# Patient Record
Sex: Male | Born: 1995 | Race: Black or African American | Hispanic: No | Marital: Single | Smoking: Never smoker
Health system: Southern US, Community
[De-identification: ages and names within clinical notes are randomized; demographics above are authoritative.]

---
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
## 2018-06-28 ENCOUNTER — Emergency Department (HOSPITAL_COMMUNITY)
Admission: EM | Admit: 2018-06-28 | Discharge: 2018-06-29 | Disposition: A | Discharge status: Home / Self Care | Payer: Self-pay | Attending: Emergency Medicine | Admitting: Emergency Medicine

## 2018-06-28 ENCOUNTER — Emergency Department (HOSPITAL_COMMUNITY): Payer: Self-pay

## 2018-06-28 ENCOUNTER — Encounter (HOSPITAL_COMMUNITY): Payer: Self-pay

## 2018-06-28 ENCOUNTER — Other Ambulatory Visit: Payer: Self-pay

## 2018-06-28 DIAGNOSIS — N50819 Testicular pain, unspecified: Secondary | ICD-10-CM

## 2018-06-28 DIAGNOSIS — N50812 Left testicular pain: Secondary | ICD-10-CM | POA: Insufficient documentation

## 2018-06-28 NOTE — ED Triage Notes (Signed)
Pt from home w/ a c/o testicular pain that began ~10 mins PTA. Pt reports he had been doing push ups and sit ups about one hr prior to experiencing the pain. Pt was resting when the pain began. Pt denies injury, rash, or swelling.

## 2018-06-29 ENCOUNTER — Emergency Department (HOSPITAL_COMMUNITY): Payer: Self-pay

## 2018-06-29 LAB — URINALYSIS, ROUTINE W REFLEX MICROSCOPIC
Bilirubin Urine: NEGATIVE
Glucose, UA: NEGATIVE mg/dL
Hgb urine dipstick: NEGATIVE
Ketones, ur: NEGATIVE mg/dL
Leukocytes, UA: NEGATIVE
Nitrite: NEGATIVE
Protein, ur: NEGATIVE mg/dL
Specific Gravity, Urine: 1.014 (ref 1.005–1.030)
pH: 7 (ref 5.0–8.0)

## 2018-06-29 LAB — GC/CHLAMYDIA PROBE AMP (~~LOC~~) NOT AT ARMC
Chlamydia: NEGATIVE
Neisseria Gonorrhea: NEGATIVE

## 2018-06-29 MED ORDER — DOXYCYCLINE HYCLATE 100 MG PO CAPS: 100.0000 mg | ORAL_CAPSULE | Freq: Two times a day (BID) | ORAL | 0 refills | Status: AC

## 2018-06-29 NOTE — Discharge Instructions (Addendum)
1. Medications: Doxycycline, usual home medications 2. Treatment: rest, drink plenty of fluids,  3. Follow Up: Please followup with your primary doctor in 2-3 days for discussion of your diagnoses and further evaluation after today's visit; if you do not have a primary care doctor use the resource guide provided to find one; Please return to the ER for fever, worsening pain, vomiting or other concerns

## 2018-06-29 NOTE — ED Notes (Signed)
Patient transported to Ultrasound 

## 2018-06-29 NOTE — ED Provider Notes (Signed)
[facility]   CSN: [id number] Arrival date & time: 06/28/18  2306     History   Chief Complaint Chief Complaint  Patient presents with  . Testicle Pain    HPI Roberto Galloway is a 22 y.o. male with no major medical problems presents to the Emergency Department complaining of gradual, persistent, progressively worsening left testicular pain onset approximately 10 minutes prior to arrival.  Patient reports that he was resting in bed when the pain began but had been working out doing sit ups and push-ups approximately 1 hour prior to the onset of pain.  He reports pain is rated at a 4/10 and aching in nature.  He denies, chills, abdominal pain, nausea, vomiting, dysuria, hematuria, penile discharge.  He reports he is not sexually active.  He denies history of STD.  Specific aggravating or alleviating factors.  Denies a history of testicular torsion or genital surgery.   The history is provided by the patient and medical records. No language interpreter was used.    History reviewed. No pertinent past medical history.  There are no active problems to display for this patient.   History reviewed. No pertinent surgical history.      Home Medications    Prior to Admission medications   Medication Sig Start Date End Date Taking? Authorizing Provider  doxycycline (VIBRAMYCIN) 100 MG capsule Take 1 capsule (100 mg total) by mouth 2 (two) times daily. 06/29/18   Masud Holub, Dahlia ClientHannah, PA-C    Family History History reviewed. No pertinent family history.  Social History Social History   Tobacco Use  . Smoking status: Never Smoker  . Smokeless tobacco: Never Used  Substance Use Topics  . Alcohol use: Never    Frequency: Never  . Drug use: Never     Allergies   Patient has no allergy information on record.   Review of Systems Review of Systems  Constitutional: Negative for appetite change, diaphoresis, fatigue, fever  and unexpected weight change.  HENT: Negative for mouth sores.   Eyes: Negative for visual disturbance.  Respiratory: Negative for cough, chest tightness, shortness of breath and wheezing.   Cardiovascular: Negative for chest pain.  Gastrointestinal: Negative for abdominal pain, constipation, diarrhea, nausea and vomiting.  Endocrine: Negative for polydipsia, polyphagia and polyuria.  Genitourinary: Positive for testicular pain. Negative for dysuria, frequency, hematuria and urgency.  Musculoskeletal: Negative for back pain and neck stiffness.  Skin: Negative for rash.  Allergic/Immunologic: Negative for immunocompromised state.  Neurological: Negative for syncope, light-headedness and headaches.  Hematological: Does not bruise/bleed easily.  Psychiatric/Behavioral: Negative for sleep disturbance. The patient is not nervous/anxious.      Physical Exam Updated Vital Signs BP 139/78 (BP Location: Right Arm)   Pulse 71   Temp 98.9 F (37.2 C) (Oral)   Resp 14   Ht 5\' 8"  (1.727 m)   Wt 50.8 kg   SpO2 99%   BMI 17.03 kg/m   Physical Exam  Constitutional: He appears well-developed and well-nourished. No distress.  Awake, alert, nontoxic appearance  HENT:  Head: Normocephalic and atraumatic.  Mouth/Throat: Oropharynx is clear and moist. No oropharyngeal exudate.  Eyes: Conjunctivae are normal. No scleral icterus.  Neck: Normal range of motion. Neck supple.  Cardiovascular: Normal rate, regular rhythm and intact distal pulses.  Pulmonary/Chest: Effort normal and breath sounds normal. No respiratory distress. He has no wheezes.  Equal chest expansion  Abdominal: Soft. Bowel sounds are normal. He exhibits no mass. There is no  tenderness. There is no rebound and no guarding. Hernia confirmed negative in the right inguinal area and confirmed negative in the left inguinal area.  Genitourinary: Penis normal. Cremasteric reflex is present. Right testis shows no mass, no swelling and no  tenderness. Right testis is descended. Cremasteric reflex is not absent on the right side. Left testis shows tenderness. Left testis shows no mass and no swelling. Left testis is descended. Cremasteric reflex is not absent on the left side. Circumcised. No phimosis, paraphimosis, hypospadias, penile erythema or penile tenderness. No discharge found.  Genitourinary Comments: Chaperone present  Musculoskeletal: Normal range of motion. He exhibits no edema.  Lymphadenopathy: No inguinal adenopathy noted on the right or left side.  Neurological: He is alert.  Speech is clear and goal oriented Moves extremities without ataxia  Skin: Skin is warm and dry. He is not diaphoretic.  Psychiatric: He has a normal mood and affect.  Nursing note and vitals reviewed.    ED Treatments / Results  Labs (all labs ordered are listed, but only abnormal results are displayed) Labs Reviewed  URINALYSIS, ROUTINE W REFLEX MICROSCOPIC  GC/CHLAMYDIA PROBE AMP ([place]) NOT AT [facility]    EKG None  Radiology US Scrotum W/doppler  Result Date: 06/29/2018 CLINICAL DATA:  22 year old male with testicular pain. EXAM: SCROTAL ULTRASOUND DOPPLER ULTRASOUND OF THE TESTICLES TECHNIQUE: Complete ultrasound examination of the testicles, epididymis, and other scrotal structures was performed. Color and spectral Doppler ultrasound were also utilized to evaluate blood flow to the testicles. COMPARISON:  None. FINDINGS: Right testicle Measurements: 4.2 x 1.8 x 2.4 cm. No mass or microlithiasis visualized. Left testicle Measurements: 4.1 x 2.0 x 2.2 cm. No mass or microlithiasis visualized. Right epididymis:  Normal in size and appearance. Left epididymis:  Normal in size and appearance. Hydrocele:  None visualized. Varicocele:  None visualized. Pulsed Doppler interrogation of both testes demonstrates normal low resistance arterial and venous waveforms bilaterally. IMPRESSION: Unremarkable testicular ultrasound. Electronically  Signed   By: Elgie Collard M.D.   On: 06/29/2018 00:46    Procedures Procedures (including critical care time)  Medications Ordered in ED Medications - No data to display   Initial Impression / Assessment and Plan / ED Course  I have reviewed the triage vital signs and the nursing notes.  Pertinent labs & imaging results that were available during my care of the patient were reviewed by me and considered in my medical decision making (see chart for details).     Patient presents with left testicular pain onset just prior to arrival.  On exam, cremaster reflex is present and tenderness seems to be localized to the epididymis of the left testicle.  Left testicle is not high riding.  Less likely to be ovarian torsion however will obtain scrotal ultrasound and urinalysis.  Patient denies all urinary symptoms.  1:30 AM Also is without evidence of urinary tract infection.  Patient low risk for gonorrhea and chlamydia.  Cultures pending.  Ultrasound without evidence of testicular torsion or epididymitis however clinically consistent with epididymitis.  Will treat with doxycycline.  Patient is to have close follow-up with his primary care provider.  Gust reasons to return immediately to the emergency department.  Patient states understanding and is in agreement with the plan.  Final Clinical Impressions(s) / ED Diagnoses   Final diagnoses:  Testicular pain    ED Discharge Orders         Ordered    doxycycline (VIBRAMYCIN) 100 MG capsule  2 times daily  06/29/18 0124           Dajuan Turnley, Dahlia Client, PA-C 06/29/18 0131    Glynn Octave, MD 06/29/18 [phone]

## 2019-12-21 IMAGING — US US SCROTUM W/ DOPPLER COMPLETE
1 series · 14 of 25 positions shown · non-contrast
Comparison: None.

CLINICAL DATA: 22-year-old male with testicular pain.

EXAM:
SCROTAL ULTRASOUND
DOPPLER ULTRASOUND OF THE TESTICLES
TECHNIQUE: Complete ultrasound examination of the testicles, epididymis, and
other scrotal structures was performed. Color and spectral Doppler
ultrasound were also utilized to evaluate blood flow to the
testicles.

[Series 1: us scrotum w/ doppler complete · 0.07mm/px · 14 of 51 slices shown]
[im 1/51]
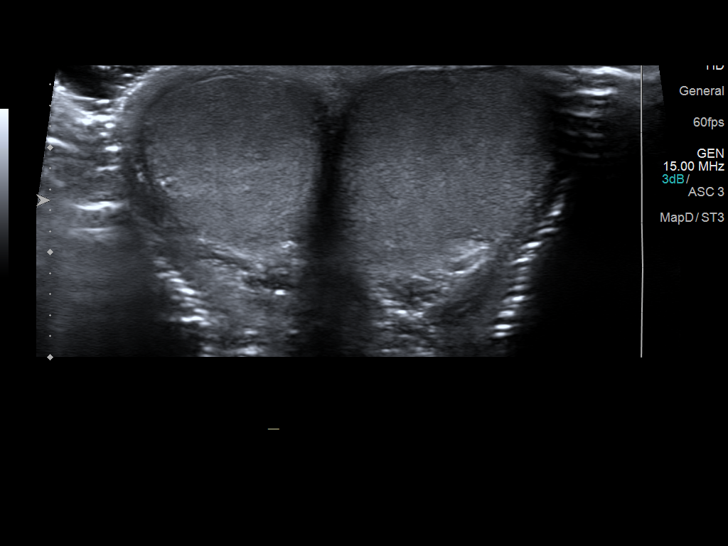
[im 5/51]
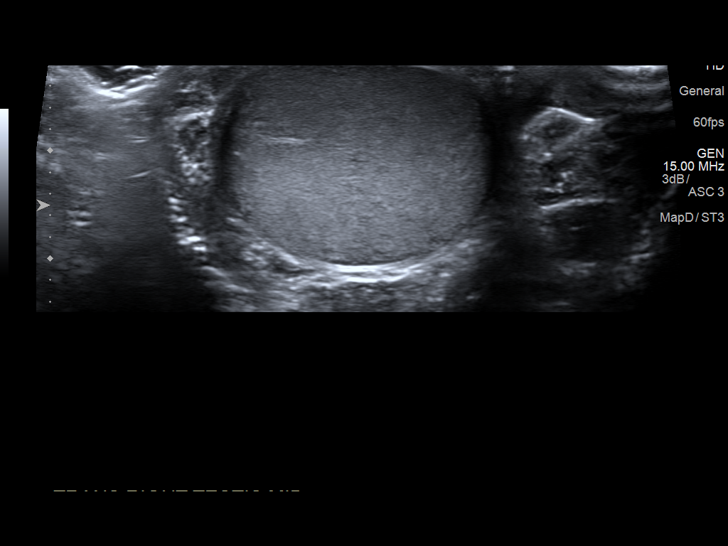
[im 9/51]
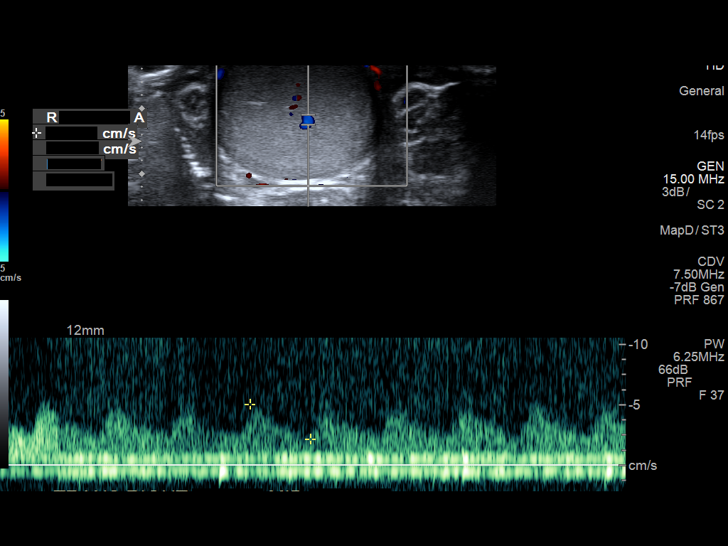
[im 13/51]
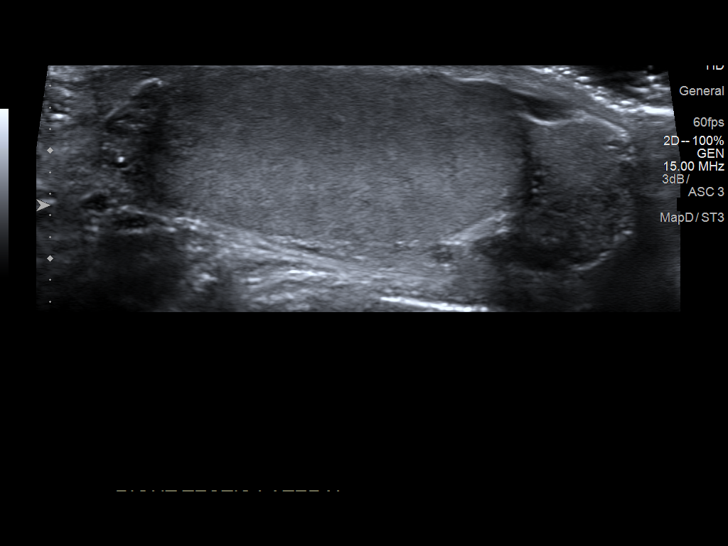
[im 17/51]
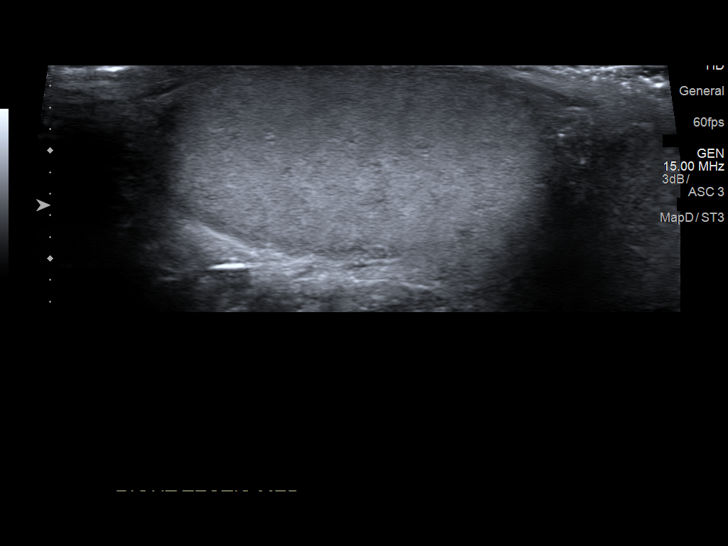
[im 19/51]
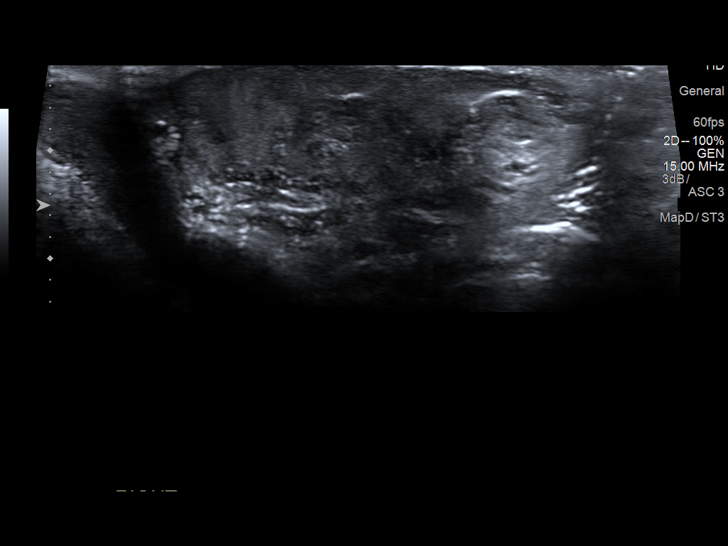
[im 23/51]
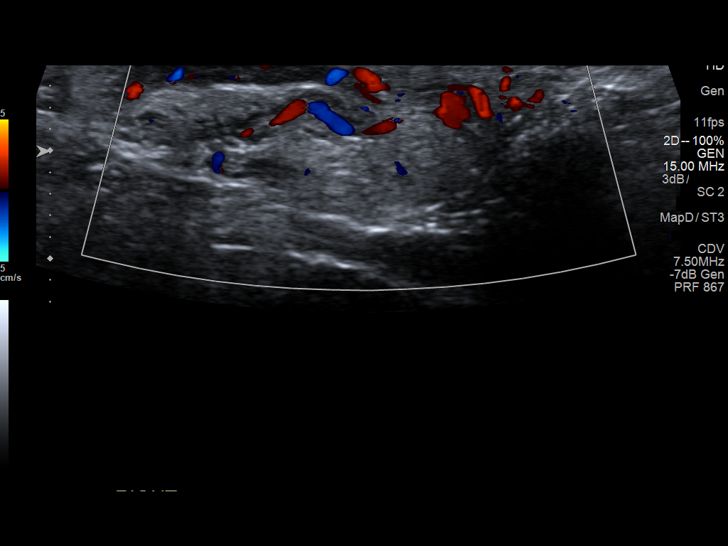
[im 28/51]
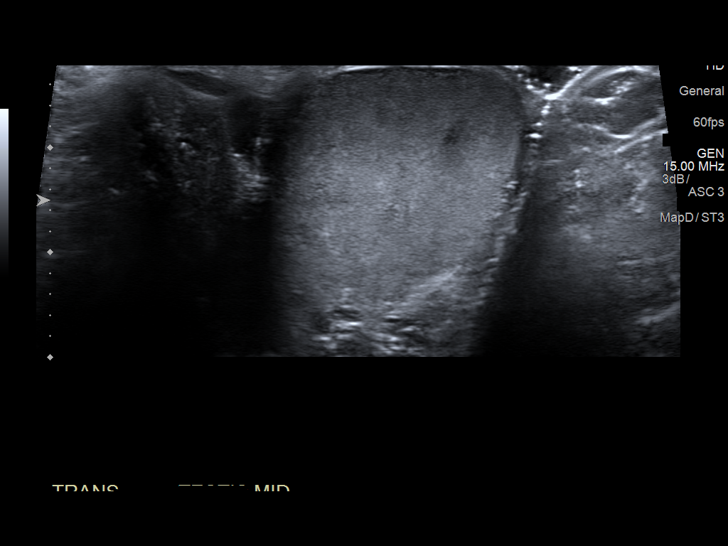
[im 32/51]
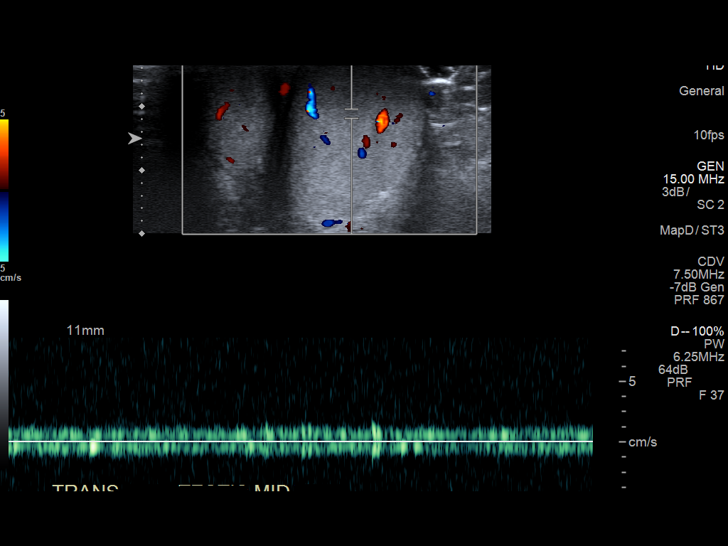
[im 34/51]
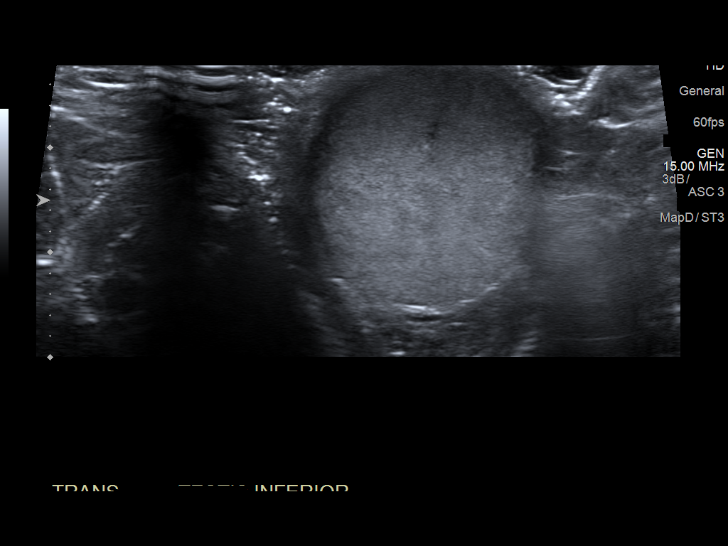
[im 38/51]
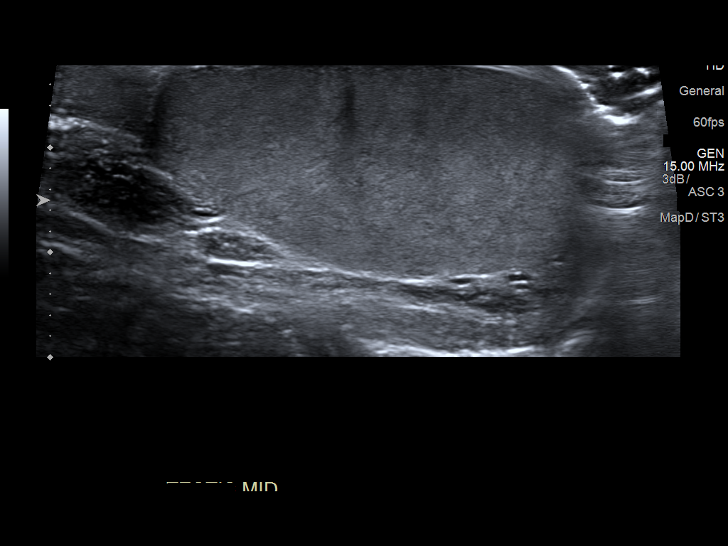
[im 42/51]
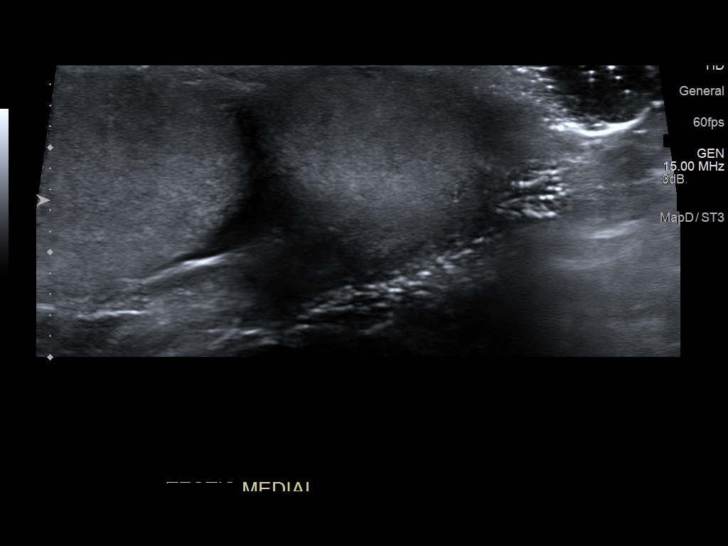
[im 46/51]
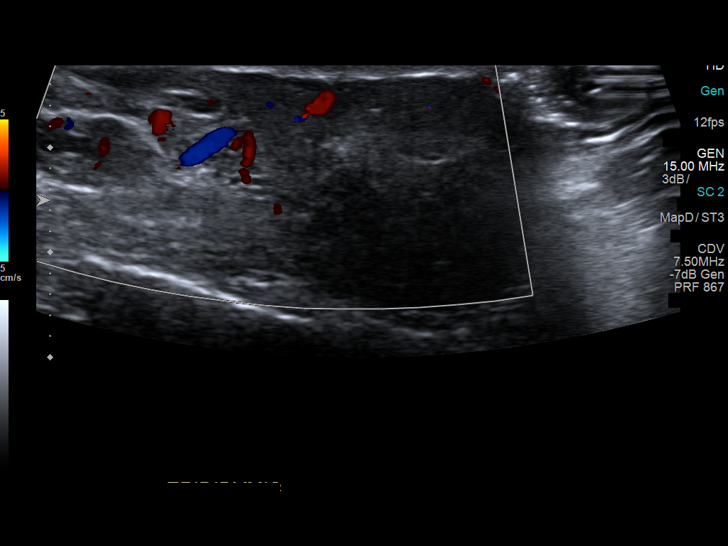
[im 51/51]
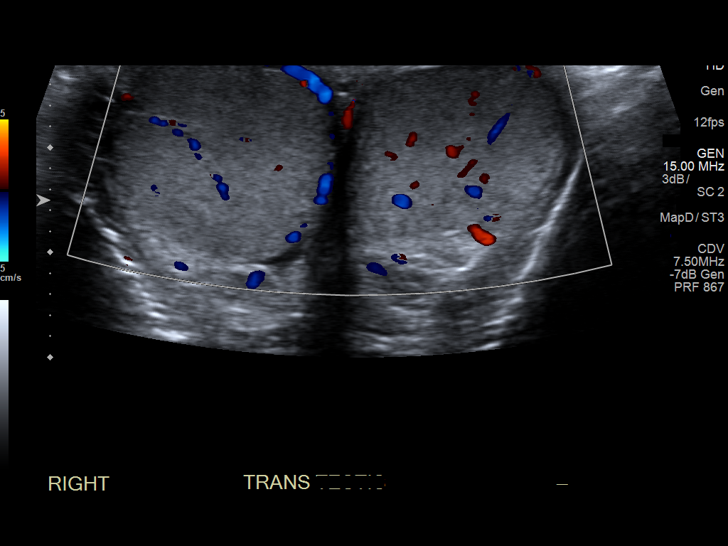

[14 of 25 positions shown; findings below may reference images not displayed]

FINDINGS: Right testicle

Measurements: 4.2 x 1.8 x 2.4 cm. No mass or microlithiasis
visualized.

Left testicle

Measurements: 4.1 x 2.0 x 2.2 cm. No mass or microlithiasis
visualized.

Right epididymis:  Normal in size and appearance.

Left epididymis:  Normal in size and appearance.

Hydrocele:  None visualized.

Varicocele:  None visualized.

Pulsed Doppler interrogation of both testes demonstrates normal low
resistance arterial and venous waveforms bilaterally.
IMPRESSION: Unremarkable testicular ultrasound.
